# Patient Record
Sex: Male | Born: 1985 | Race: White | Hispanic: No | Smoking: Current every day smoker
Health system: Southern US, Community
[De-identification: ages and names within clinical notes are randomized; demographics above are authoritative.]

---

## 2015-12-10 ENCOUNTER — Encounter: Payer: Self-pay | Admitting: *Deleted

## 2015-12-10 ENCOUNTER — Emergency Department
Admission: EM | Admit: 2015-12-10 | Discharge: 2015-12-10 | Disposition: A | Discharge status: Home / Self Care | Payer: Self-pay | Attending: Emergency Medicine | Admitting: Emergency Medicine

## 2015-12-10 ENCOUNTER — Emergency Department: Payer: Self-pay

## 2015-12-10 ENCOUNTER — Emergency Department
Admission: EM | Admit: 2015-12-10 | Discharge: 2015-12-10 | Disposition: A | Discharge status: Home / Self Care | Payer: Self-pay | Attending: Student | Admitting: Student

## 2015-12-10 DIAGNOSIS — S51811A Laceration without foreign body of right forearm, initial encounter: Secondary | ICD-10-CM | POA: Insufficient documentation

## 2015-12-10 DIAGNOSIS — Y999 Unspecified external cause status: Secondary | ICD-10-CM | POA: Insufficient documentation

## 2015-12-10 DIAGNOSIS — Y9389 Activity, other specified: Secondary | ICD-10-CM | POA: Insufficient documentation

## 2015-12-10 DIAGNOSIS — F17210 Nicotine dependence, cigarettes, uncomplicated: Secondary | ICD-10-CM | POA: Insufficient documentation

## 2015-12-10 DIAGNOSIS — Y929 Unspecified place or not applicable: Secondary | ICD-10-CM | POA: Insufficient documentation

## 2015-12-10 DIAGNOSIS — S62315A Displaced fracture of base of fourth metacarpal bone, left hand, initial encounter for closed fracture: Secondary | ICD-10-CM | POA: Insufficient documentation

## 2015-12-10 DIAGNOSIS — IMO0002 Reserved for concepts with insufficient information to code with codable children: Secondary | ICD-10-CM

## 2015-12-10 DIAGNOSIS — W25XXXA Contact with sharp glass, initial encounter: Secondary | ICD-10-CM | POA: Insufficient documentation

## 2015-12-10 DIAGNOSIS — S62308A Unspecified fracture of other metacarpal bone, initial encounter for closed fracture: Secondary | ICD-10-CM

## 2015-12-10 DIAGNOSIS — Y939 Activity, unspecified: Secondary | ICD-10-CM | POA: Insufficient documentation

## 2015-12-10 MED ORDER — LIDOCAINE-EPINEPHRINE (PF) 1 %-1:200000 IJ SOLN
INTRAMUSCULAR | Status: AC
  Administered 2015-12-10: 30 mL via INTRADERMAL
  Filled 2015-12-10: qty 30

## 2015-12-10 MED ORDER — IBUPROFEN 600 MG PO TABS: 600.0000 mg | ORAL_TABLET | Freq: Three times a day (TID) | ORAL | Status: DC | PRN

## 2015-12-10 MED ORDER — LIDOCAINE-EPINEPHRINE (PF) 1 %-1:200000 IJ SOLN
30.0000 mL | Freq: Once | INTRAMUSCULAR | Status: AC
  Administered 2015-12-10: 30 mL via INTRADERMAL

## 2015-12-10 MED ORDER — BACITRACIN ZINC 500 UNIT/GM EX OINT
TOPICAL_OINTMENT | Freq: Once | CUTANEOUS | Status: AC
  Administered 2015-12-10: 1 via TOPICAL
  Filled 2015-12-10: qty 0.9
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
## 2015-12-10 NOTE — ED Notes (Signed)
Pt able to ambulate without any issues.  
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
## 2015-12-10 NOTE — ED Notes (Signed)
Bacitracin placed over laceration and dressed with gauze.
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
## 2015-12-10 NOTE — Discharge Instructions (Signed)
Ice and elevate when possible. Leave splint on for protection until seen by the orthopedist. Call and make an appointment with Dr. Rosita KeaMenz who is the orthopedist on call this week. Ibuprofen with food as directed for pain.

## 2015-12-10 NOTE — ED Notes (Signed)
NAD noted at time of D/C. Pt agitated with this RN and somewhat verbally aggressive stating, "that's all this place is good for, a f*cking ibuprofen and a bandaid". Pt states understanding of D/C instructions and requests a copy of D/C instructions regarding his sutures from this morning, this RN provided a copy. Pt D/C in police custody at this time. Pt ambulatory to the lobby.
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
## 2015-12-24 ENCOUNTER — Emergency Department
Admission: EM | Admit: 2015-12-24 | Discharge: 2015-12-24 | Disposition: A | Discharge status: Home / Self Care | Payer: No Typology Code available for payment source | Attending: Emergency Medicine | Admitting: Emergency Medicine

## 2015-12-24 ENCOUNTER — Emergency Department: Payer: No Typology Code available for payment source

## 2015-12-24 DIAGNOSIS — Y92410 Unspecified street and highway as the place of occurrence of the external cause: Secondary | ICD-10-CM | POA: Insufficient documentation

## 2015-12-24 DIAGNOSIS — Y999 Unspecified external cause status: Secondary | ICD-10-CM | POA: Diagnosis not present

## 2015-12-24 DIAGNOSIS — F17210 Nicotine dependence, cigarettes, uncomplicated: Secondary | ICD-10-CM | POA: Diagnosis not present

## 2015-12-24 DIAGNOSIS — Y9355 Activity, bike riding: Secondary | ICD-10-CM | POA: Insufficient documentation

## 2015-12-24 DIAGNOSIS — S86911A Strain of unspecified muscle(s) and tendon(s) at lower leg level, right leg, initial encounter: Secondary | ICD-10-CM

## 2015-12-24 DIAGNOSIS — Z4802 Encounter for removal of sutures: Secondary | ICD-10-CM | POA: Insufficient documentation

## 2015-12-24 DIAGNOSIS — S20211A Contusion of right front wall of thorax, initial encounter: Secondary | ICD-10-CM

## 2015-12-24 DIAGNOSIS — T148XXA Other injury of unspecified body region, initial encounter: Secondary | ICD-10-CM

## 2015-12-24 DIAGNOSIS — S8981XA Other specified injuries of right lower leg, initial encounter: Secondary | ICD-10-CM | POA: Insufficient documentation

## 2015-12-24 DIAGNOSIS — S50312A Abrasion of left elbow, initial encounter: Secondary | ICD-10-CM | POA: Diagnosis not present

## 2015-12-24 MED ORDER — CEPHALEXIN 500 MG PO CAPS: 500.0000 mg | ORAL_CAPSULE | Freq: Three times a day (TID) | ORAL | Status: AC

## 2015-12-24 MED ORDER — IBUPROFEN 800 MG PO TABS
800.0000 mg | ORAL_TABLET | Freq: Once | ORAL | Status: AC
  Administered 2015-12-24: 800 mg via ORAL
  Filled 2015-12-24: qty 1

## 2015-12-24 MED ORDER — CEPHALEXIN 500 MG PO CAPS: 500.0000 mg | ORAL_CAPSULE | Freq: Once | ORAL | Status: DC

## 2015-12-24 MED ORDER — CEPHALEXIN 500 MG PO CAPS
500.0000 mg | ORAL_CAPSULE | Freq: Once | ORAL | Status: AC
  Administered 2015-12-24: 500 mg via ORAL
  Filled 2015-12-24: qty 1

## 2015-12-24 MED ORDER — IBUPROFEN 800 MG PO TABS: 800.0000 mg | ORAL_TABLET | Freq: Three times a day (TID) | ORAL | Status: AC | PRN

## 2015-12-24 NOTE — ED Notes (Signed)
NAD noted at time of D/C. Pt states understanding of D/C instructions. VSS. Pt calm and cooperative during D/C instructions. Pt left ED in police custody.
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
## 2015-12-24 NOTE — Discharge Instructions (Signed)
Chest Contusion A contusion is a deep bruise. Bruises happen when an injury causes bleeding under the skin. Signs of bruising include pain, puffiness (swelling), and discolored skin. The bruise may turn blue, purple, or yellow.  HOME CARE  Put ice on the injured area.  Put ice in a plastic bag.  Place a towel between the skin and the bag.  Leave the ice on for 15-20 minutes at a time, 03-04 times a day for the first 48 hours.  Only take medicine as told by your doctor.  Rest.  Take deep breaths (deep-breathing exercises) as told by your doctor.  Stop smoking if you smoke.  Do not lift objects over 5 pounds (2.3 kilograms) for 3 days or longer if told by your doctor. GET HELP RIGHT AWAY IF:   You have more bruising or puffiness.  You have pain that gets worse.  You have trouble breathing.  You are dizzy, weak, or pass out (faint).  You have blood in your pee (urine) or poop (stool).  You cough up or throw up (vomit) blood.  Your puffiness or pain is not helped with medicines. MAKE SURE YOU:   Understand these instructions.  Will watch your condition.  Will get help right away if you are not doing well or get worse.   This information is not intended to replace advice given to you by your health care provider. Make sure you discuss any questions you have with your health care provider.   Document Released: 01/08/2008 Document Revised: 04/15/2012 Document Reviewed: 01/13/2012 Elsevier Interactive Patient Education 2016 [employer]  Incision Care  An incision (cut) is when a surgeon cuts into your body. After surgery, the cut needs to be well cared for to keep it from getting infected.  HOW TO CARE FOR YOUR CUT  Take medicines only as told by your doctor.  There are many different ways to close and cover a cut, including stitches, skin glue, and adhesive strips. Follow your doctor's instructions on:  Care of the cut.  Bandage (dressing) changes and  removal.  Cut closure removal.  Do not take baths, swim, or use a hot tub until your doctor says it is okay. You may shower as told by your doctor.  Return to your normal diet and activities as allowed by your doctor.  Use medicine that helps lessen itching on your cut as told by your doctor. Do not pick or scratch at your cut.  Drink enough fluids to keep your pee (urine) clear or pale yellow. GET HELP IF:  You have redness, puffiness (swelling), or pain at the site of your cut.  You have fluid, blood, or pus coming from your cut.  Your muscles ache.  You have chills or you feel sick.  You have a bad smell coming from the cut or bandage.  Your cut opens up after stitches, staples, or adhesive strips have been removed.  You keep feeling sick to your stomach (nauseous) or keep throwing up (vomiting).  You have a fever.  You are dizzy. GET HELP RIGHT AWAY IF:  You have a rash.  You pass out (faint).  You have trouble breathing. MAKE SURE YOU:   Understand these instructions.  Will watch your condition.  Will get help right away if you are not doing well or get worse.   This information is not intended to replace advice given to you by your health care provider. Make sure you discuss any questions you have with your health  care provider.   Document Released: 10/14/2011 Document Revised: 08/12/2014 Document Reviewed: 09/15/2013 [employer] Interactive Patient Education 2016 [employer]  Wound Closure Removal The staples, stitches, or skin adhesives that were used to close your skin have been removed. You will need to continue the care described here until the wound is completely healed and your health care provider confirms that wound care can be stopped. HOW DO I CARE FOR MY WOUND? How you care for your wound after the wound closure has been removed depends on the kind of wound closure you had. Stitches or Staples  Keep the wound site dry and clean. Do not soak it  in water.  If skin adhesive strips were applied after the staples were removed, they will begin to peel off in a few days. Allow them to remain in place until they fall off on their own.  If you still have a bandage (dressing), change it at least once a day or as directed by your health care provider. If the dressing sticks, pour warm, sterile water over it until it loosens and can be removed without pulling apart the wound edges. Pat the area dry with a soft, clean towel. Do not rub the wound because that may cause bleeding.  Apply cream or ointment that stops the growth of bacteria (antibacterial cream or antibacterial ointment) only if your health care provider has directed you to do so.  Place a nonstick bandage over the wound to prevent the dressing from sticking.  Cover the nonstick bandage with a new dressing as directed by your health care provider.  If the bandage becomes wet or dirty or it develops a bad smell, change it as soon as possible.  Take medicines only as directed by your health care provider. Adhesive Strips or Glue  Adhesive strips and glue peel off on their own.  Leave adhesive strips and glue in place until they fall off. ARE THERE ANY BATHING RESTRICTIONS ONCE MY WOUND CLOSURE IS REMOVED? Do not take baths, swim, or use a hot tub until your health care provider approves. HOW CAN I DECREASE THE SIZE OF MY SCAR? How your scar heals and the size of your scar depend on many factors, such as your age, the type of scar you have, and genetic factors. The following may help decrease the size of your scar:  Sunscreen. Use sunscreen with a sun protection factor (SPF) of at least 15 when out in the sun. Reapply the sunscreen every two hours.  Friction massage. Once your wound is completely healed, you can gently massage the scarred area. This can decrease scar thickness. WHEN SHOULD I SEEK HELP?  Seek help if:  You have a fever.  You have chills.  You have drainage,  redness, swelling, or pain at your wound.  There is a bad smell coming from your wound.  Your wound edges open up or do not stay closed after the wound closure has been removed.   This information is not intended to replace advice given to you by your health care provider. Make sure you discuss any questions you have with your health care provider.   Document Released: 07/04/2008 Document Revised: 08/12/2014 Document Reviewed: 12/07/2013 Elsevier Interactive Patient Education 2016 [employer]    Take ibuprofen as needed for pain and swelling. Also antibiotics for signs of infection on the right foot. Return to emergency room for any worsening symptoms.

## 2015-12-24 NOTE — ED Notes (Signed)
5 sutures removed from R forearm. Wound appears well healed at this time.

## 2016-04-05 DEATH — deceased
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
